# Patient Record
Sex: Male | Born: 1950 | Race: White | Hispanic: No | Marital: Single
Health system: Southern US, Community
[De-identification: ages and names within clinical notes are randomized; demographics above are authoritative.]

---

## 2004-01-21 ENCOUNTER — Emergency Department (HOSPITAL_COMMUNITY): Admission: EM | Admit: 2004-01-21 | Discharge: 2004-01-21 | Payer: Self-pay | Admitting: Emergency Medicine

## 2004-02-10 ENCOUNTER — Ambulatory Visit: Payer: Self-pay | Admitting: Internal Medicine

## 2004-02-12 ENCOUNTER — Ambulatory Visit (HOSPITAL_COMMUNITY): Admission: RE | Admit: 2004-02-12 | Discharge: 2004-02-12 | Payer: Self-pay | Admitting: Internal Medicine

## 2004-02-14 ENCOUNTER — Encounter: Admission: RE | Admit: 2004-02-14 | Discharge: 2004-02-14 | Payer: Self-pay | Admitting: Internal Medicine

## 2004-05-26 ENCOUNTER — Ambulatory Visit: Payer: Self-pay | Admitting: Internal Medicine

## 2004-10-03 ENCOUNTER — Emergency Department (HOSPITAL_COMMUNITY): Admission: EM | Admit: 2004-10-03 | Discharge: 2004-10-03 | Payer: Self-pay | Admitting: Emergency Medicine

## 2004-11-03 ENCOUNTER — Ambulatory Visit: Payer: Self-pay | Admitting: Hospitalist

## 2004-11-18 ENCOUNTER — Ambulatory Visit: Payer: Self-pay | Admitting: Internal Medicine

## 2004-12-15 ENCOUNTER — Ambulatory Visit: Payer: Self-pay | Admitting: Internal Medicine

## 2004-12-30 ENCOUNTER — Emergency Department (HOSPITAL_COMMUNITY): Admission: EM | Admit: 2004-12-30 | Discharge: 2004-12-30 | Payer: Self-pay | Admitting: Emergency Medicine

## 2005-02-05 ENCOUNTER — Ambulatory Visit: Payer: Self-pay | Admitting: Hospitalist

## 2005-03-23 ENCOUNTER — Ambulatory Visit: Payer: Self-pay | Admitting: Internal Medicine

## 2005-06-01 ENCOUNTER — Ambulatory Visit: Payer: Self-pay | Admitting: Internal Medicine

## 2005-07-20 ENCOUNTER — Ambulatory Visit: Payer: Self-pay | Admitting: Internal Medicine

## 2005-07-22 ENCOUNTER — Inpatient Hospital Stay (HOSPITAL_COMMUNITY): Admission: EM | Admit: 2005-07-22 | Discharge: 2005-07-23 | Payer: Self-pay | Admitting: Emergency Medicine

## 2005-07-22 ENCOUNTER — Ambulatory Visit: Payer: Self-pay | Admitting: Internal Medicine

## 2005-10-27 ENCOUNTER — Emergency Department (HOSPITAL_COMMUNITY): Admission: EM | Admit: 2005-10-27 | Discharge: 2005-10-27 | Payer: Self-pay | Admitting: Emergency Medicine

## 2005-11-01 ENCOUNTER — Ambulatory Visit: Payer: Self-pay | Admitting: Internal Medicine

## 2005-11-18 ENCOUNTER — Ambulatory Visit: Payer: Self-pay | Admitting: Internal Medicine

## 2006-02-07 DIAGNOSIS — M479 Spondylosis, unspecified: Secondary | ICD-10-CM | POA: Insufficient documentation

## 2006-02-07 DIAGNOSIS — M899 Disorder of bone, unspecified: Secondary | ICD-10-CM | POA: Insufficient documentation

## 2006-02-07 DIAGNOSIS — M949 Disorder of cartilage, unspecified: Secondary | ICD-10-CM

## 2006-02-07 DIAGNOSIS — E291 Testicular hypofunction: Secondary | ICD-10-CM

## 2006-02-07 DIAGNOSIS — I10 Essential (primary) hypertension: Secondary | ICD-10-CM | POA: Insufficient documentation

## 2006-02-07 DIAGNOSIS — F1020 Alcohol dependence, uncomplicated: Secondary | ICD-10-CM | POA: Insufficient documentation

## 2006-02-09 ENCOUNTER — Ambulatory Visit: Payer: Self-pay | Admitting: Internal Medicine

## 2006-02-09 ENCOUNTER — Encounter (INDEPENDENT_AMBULATORY_CARE_PROVIDER_SITE_OTHER): Payer: Self-pay | Admitting: Internal Medicine

## 2006-02-09 DIAGNOSIS — M545 Low back pain: Secondary | ICD-10-CM | POA: Insufficient documentation

## 2006-02-09 DIAGNOSIS — K219 Gastro-esophageal reflux disease without esophagitis: Secondary | ICD-10-CM | POA: Insufficient documentation

## 2006-02-09 DIAGNOSIS — G589 Mononeuropathy, unspecified: Secondary | ICD-10-CM | POA: Insufficient documentation

## 2006-02-09 LAB — CONVERTED CEMR LAB
Amphetamine Screen, Ur: NEGATIVE
BUN: 13 mg/dL (ref 6–23)
Barbiturate Quant, Ur: NEGATIVE
Benzodiazepines.: NEGATIVE
CO2: 29 meq/L (ref 19–32)
Calcium: 9.5 mg/dL (ref 8.4–10.5)
Chloride: 101 meq/L (ref 96–112)
Cocaine Metabolites: NEGATIVE
Creatinine, Ser: 1.23 mg/dL (ref 0.40–1.50)
Creatinine,U: 209.4 mg/dL
Glucose, Bld: 95 mg/dL (ref 70–99)
Marijuana Metabolite: NEGATIVE
Methadone: NEGATIVE
Opiates: POSITIVE — AB
PSA: 0.18 ng/mL (ref 0.10–4.00)
Phencyclidine (PCP): NEGATIVE
Potassium: 3.7 meq/L (ref 3.5–5.3)
Propoxyphene: NEGATIVE
Sodium: 143 meq/L (ref 135–145)

## 2006-02-21 ENCOUNTER — Ambulatory Visit: Payer: Self-pay | Admitting: Hospitalist

## 2006-02-22 ENCOUNTER — Telehealth (INDEPENDENT_AMBULATORY_CARE_PROVIDER_SITE_OTHER): Payer: Self-pay | Admitting: Internal Medicine

## 2006-02-22 DIAGNOSIS — R195 Other fecal abnormalities: Secondary | ICD-10-CM | POA: Insufficient documentation

## 2006-02-23 ENCOUNTER — Encounter (INDEPENDENT_AMBULATORY_CARE_PROVIDER_SITE_OTHER): Payer: Self-pay | Admitting: Internal Medicine

## 2006-02-23 ENCOUNTER — Ambulatory Visit: Payer: Self-pay | Admitting: Hospitalist

## 2006-02-23 LAB — CONVERTED CEMR LAB
BUN: 14 mg/dL (ref 6–23)
CO2: 26 meq/L (ref 19–32)
Calcium: 9.6 mg/dL (ref 8.4–10.5)
Chloride: 98 meq/L (ref 96–112)
Creatinine, Ser: 1.22 mg/dL (ref 0.40–1.50)
Glucose, Bld: 83 mg/dL (ref 70–99)
Potassium: 3.9 meq/L (ref 3.5–5.3)
Sodium: 136 meq/L (ref 135–145)

## 2006-03-07 ENCOUNTER — Telehealth (INDEPENDENT_AMBULATORY_CARE_PROVIDER_SITE_OTHER): Payer: Self-pay | Admitting: Hospitalist

## 2006-04-14 ENCOUNTER — Telehealth: Payer: Self-pay | Admitting: *Deleted

## 2006-06-09 ENCOUNTER — Telehealth (INDEPENDENT_AMBULATORY_CARE_PROVIDER_SITE_OTHER): Payer: Self-pay | Admitting: *Deleted

## 2006-07-15 ENCOUNTER — Telehealth: Payer: Self-pay | Admitting: *Deleted

## 2006-08-15 ENCOUNTER — Telehealth: Payer: Self-pay | Admitting: *Deleted

## 2006-08-15 IMAGING — CR DG LUMBAR SPINE COMPLETE 4+V
5 series · 5 of 5 positions shown · non-contrast
Comparison: none

HISTORY: Back pain

LUMBAR SPINE 4 VIEWS:
5 lumbar vertebra.
Disc space narrowing L5-S1 with endplate sclerosis, vacuum phenomenon, and
endplate spur formation.
No fracture, subluxation, or bone destruction.
No spondylolysis evident.
SI joints symmetric.

[view not recorded (1 of 5)]
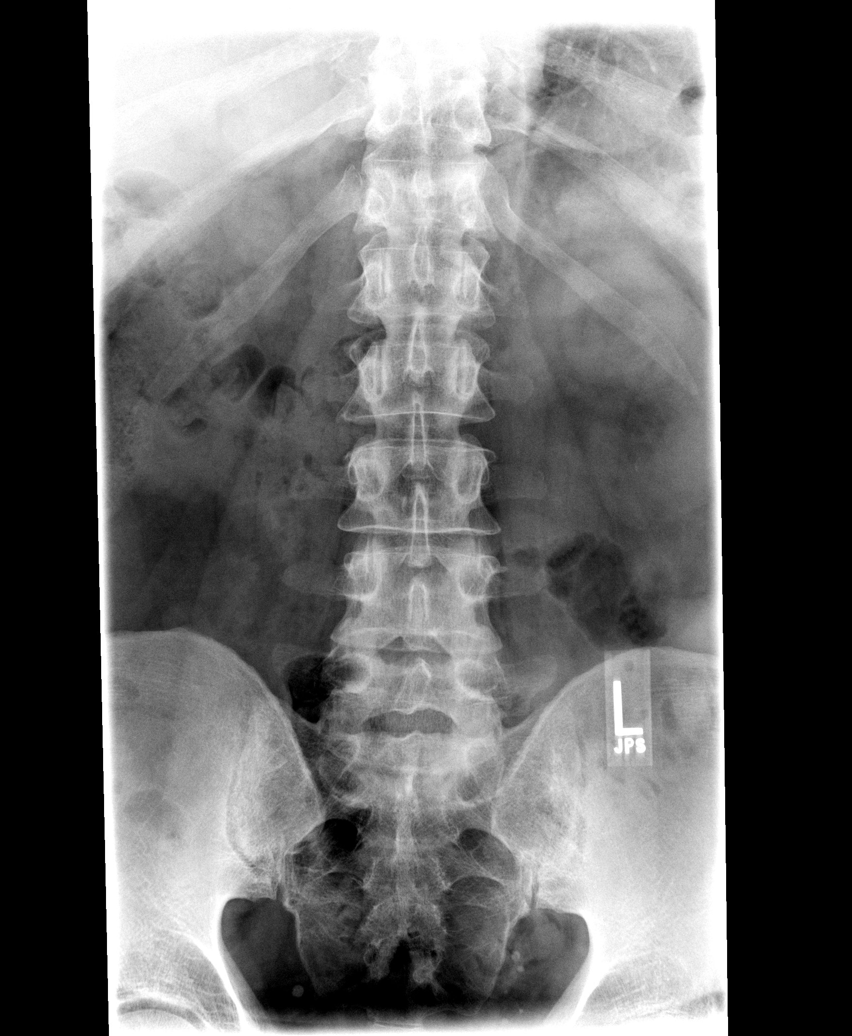

[view not recorded (2 of 5)]
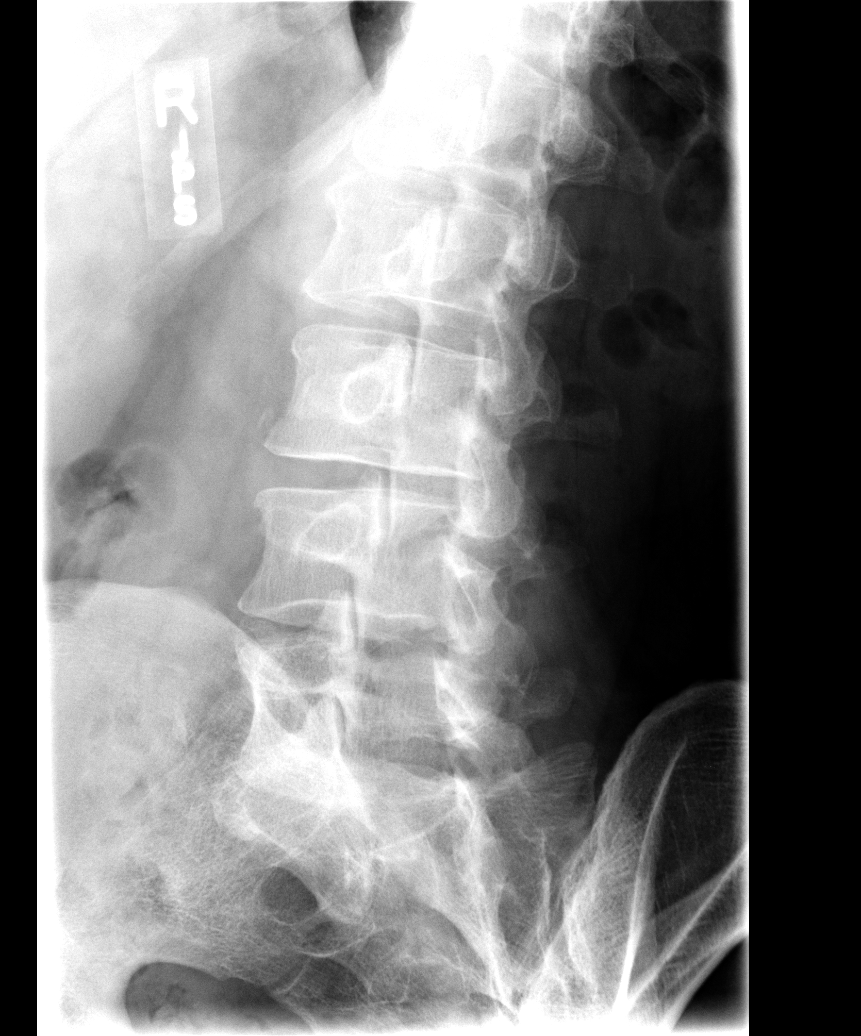

[view not recorded (3 of 5)]
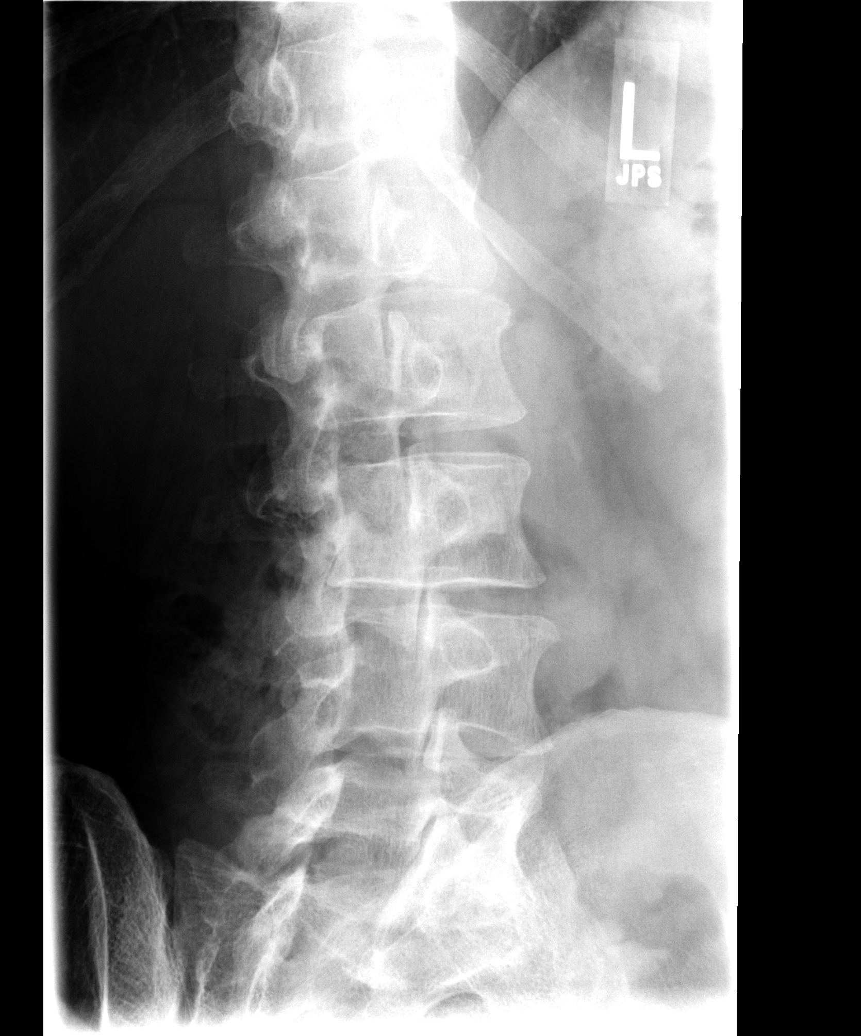

[view not recorded (4 of 5)]
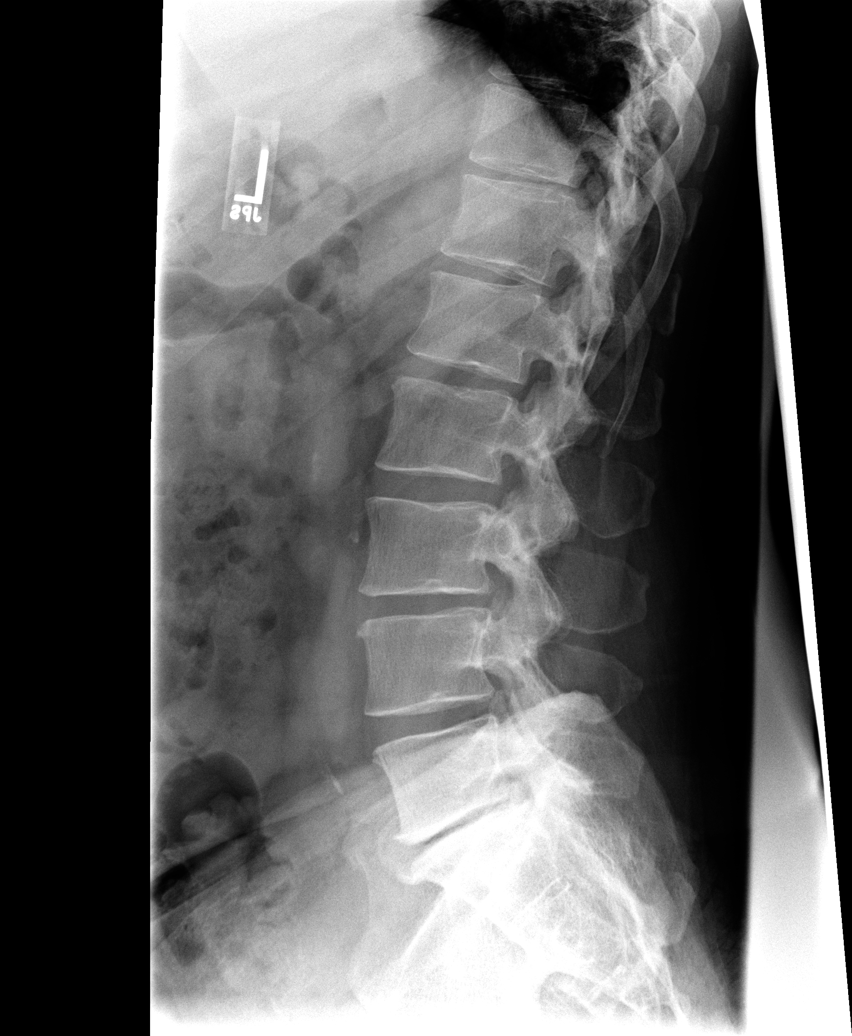

[view not recorded (5 of 5)]
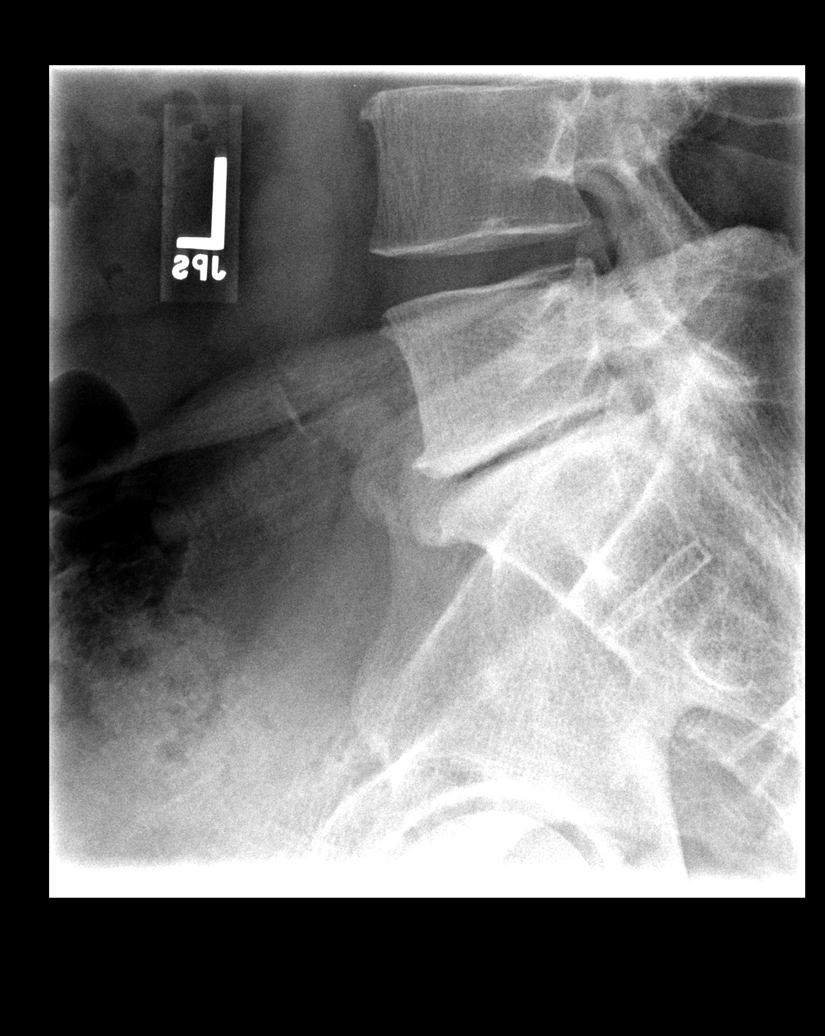

[5 of 5 positions shown; findings below may reference images not displayed]

IMPRESSION: Degenerative disc disease changes L5-S1.
No acute abnormalities.

## 2006-08-15 IMAGING — CR DG THORACIC SPINE 2V
2 series · 2 of 2 positions shown · non-contrast
Comparison: None.

CLINICAL DATA: Back pain.
 TWO-VIEW THORACIC SPINE, 01/21/04:

[view not recorded (1 of 2)]
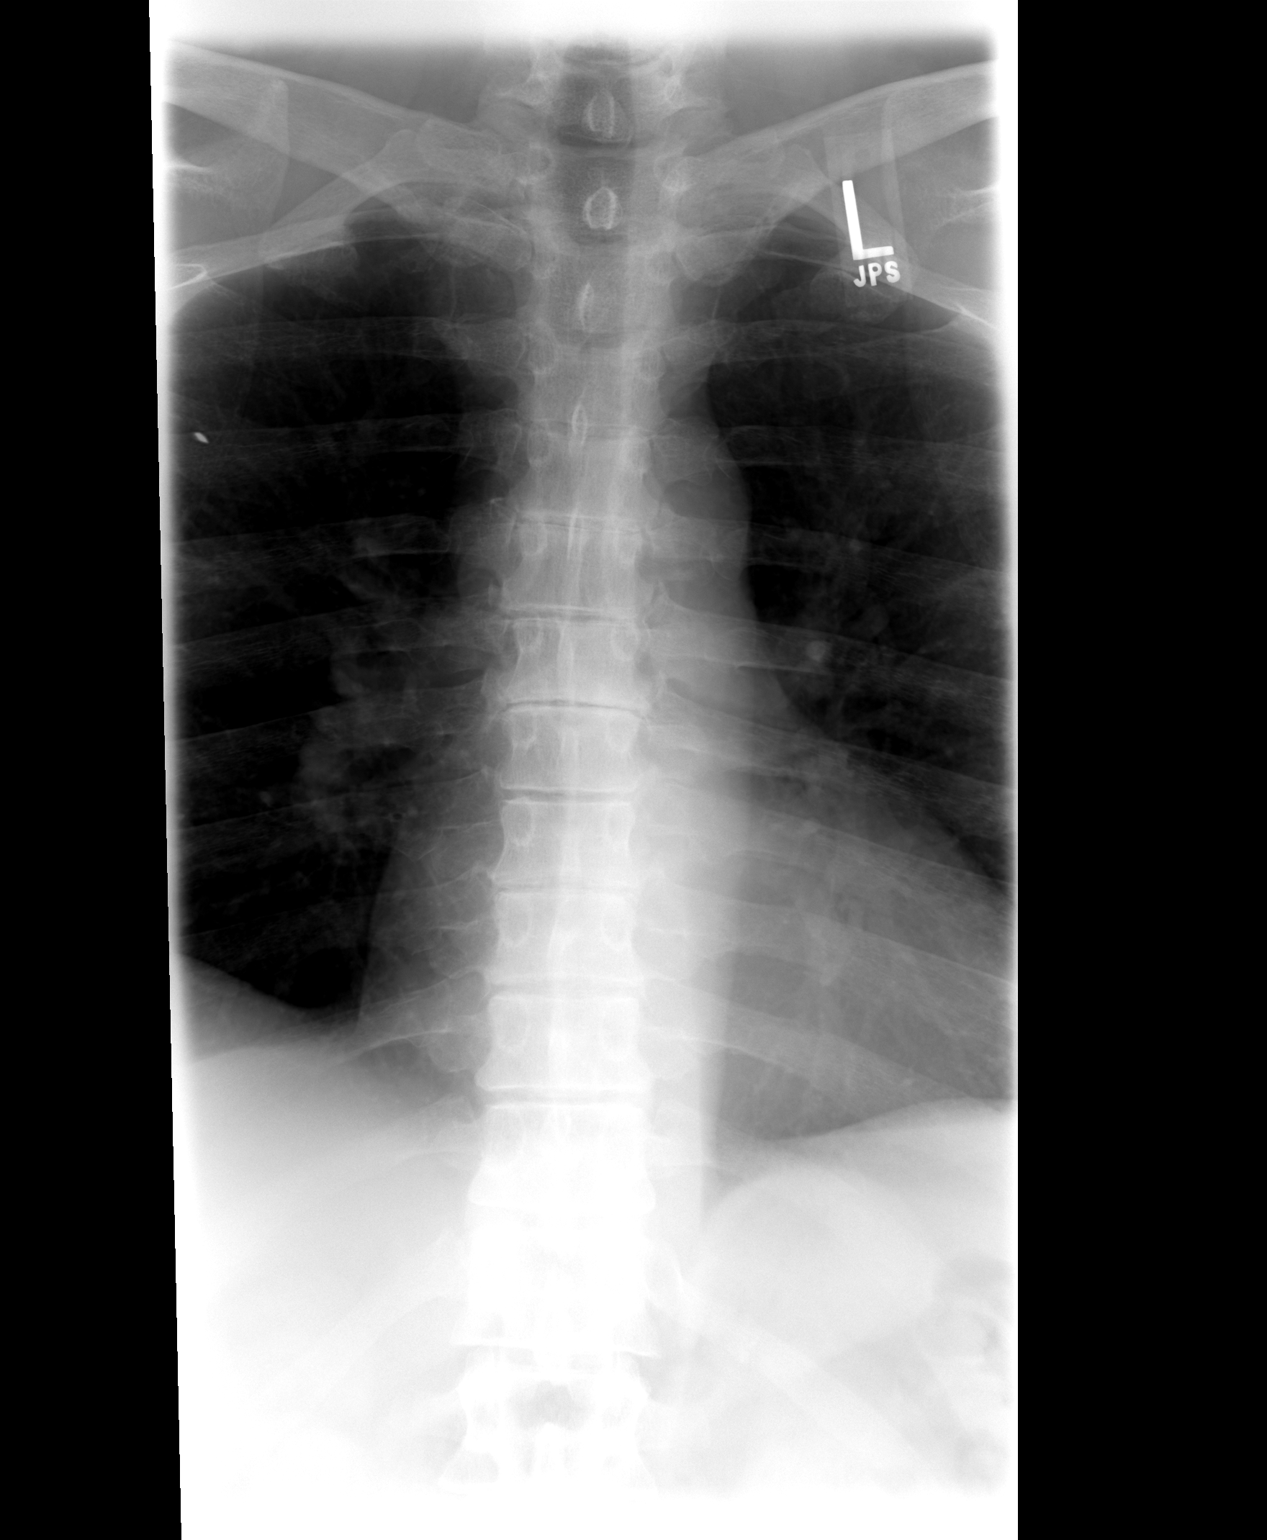

[view not recorded (2 of 2)]
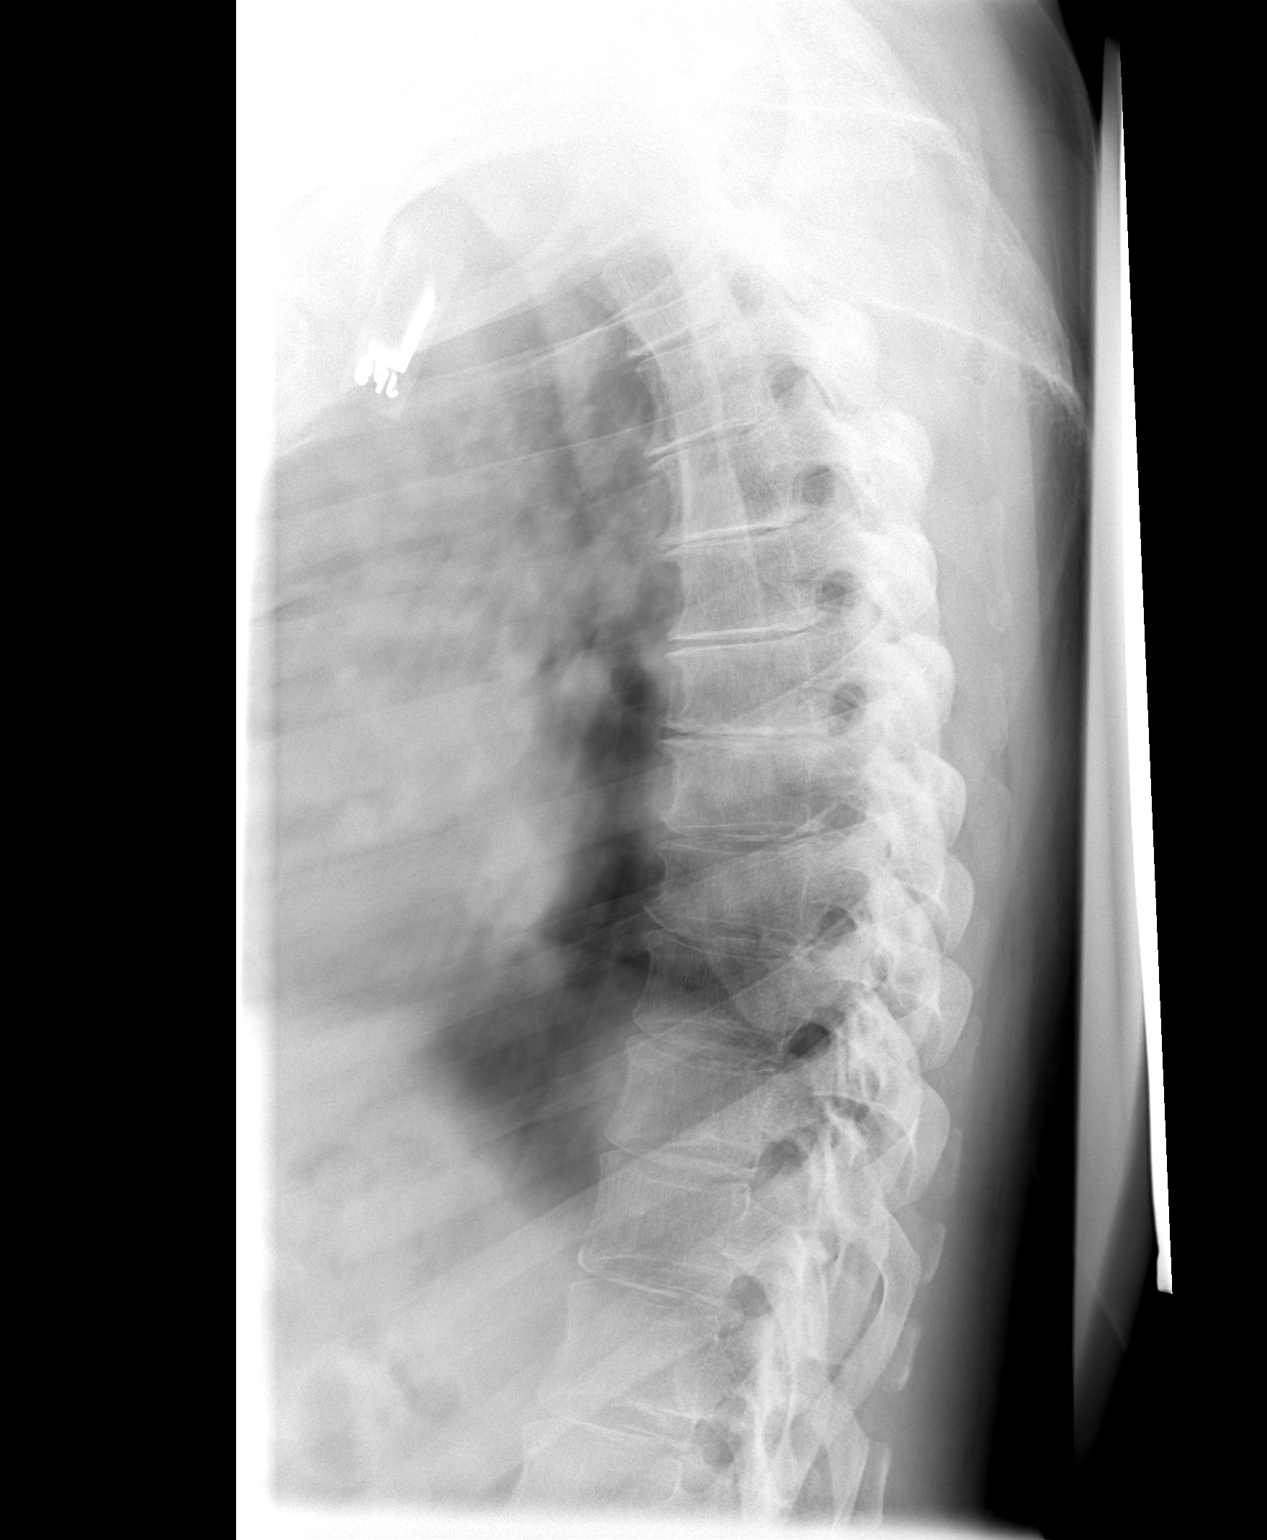

[2 of 2 positions shown; findings below may reference images not displayed]

FINDINGS: Two-view exam of the thoracic spine shows no acute fracture or subluxation.  Multilevel degenerative disc change is noted with end plate hypertrophy.  Bones are osteopenic.
IMPRESSION: No acute bony abnormality.

## 2006-09-15 ENCOUNTER — Telehealth: Payer: Self-pay | Admitting: *Deleted

## 2006-10-17 ENCOUNTER — Telehealth: Payer: Self-pay | Admitting: *Deleted

## 2006-11-02 ENCOUNTER — Encounter (INDEPENDENT_AMBULATORY_CARE_PROVIDER_SITE_OTHER): Payer: Self-pay | Admitting: Infectious Diseases

## 2006-11-02 ENCOUNTER — Ambulatory Visit: Payer: Self-pay | Admitting: Internal Medicine

## 2006-11-02 LAB — CONVERTED CEMR LAB
ALT: 22 units/L (ref 0–53)
AST: 21 units/L (ref 0–37)
Albumin: 3.9 g/dL (ref 3.5–5.2)
Alkaline Phosphatase: 84 units/L (ref 39–117)
BUN: 14 mg/dL (ref 6–23)
CO2: 24 meq/L (ref 19–32)
Calcium: 9.3 mg/dL (ref 8.4–10.5)
Chloride: 103 meq/L (ref 96–112)
Creatinine, Ser: 1.11 mg/dL (ref 0.40–1.50)
Glucose, Bld: 100 mg/dL — ABNORMAL HIGH (ref 70–99)
Potassium: 4 meq/L (ref 3.5–5.3)
Sodium: 136 meq/L (ref 135–145)
Total Bilirubin: 1 mg/dL (ref 0.3–1.2)
Total Protein: 6.8 g/dL (ref 6.0–8.3)

## 2006-11-14 ENCOUNTER — Telehealth (INDEPENDENT_AMBULATORY_CARE_PROVIDER_SITE_OTHER): Payer: Self-pay | Admitting: Infectious Diseases

## 2006-12-16 ENCOUNTER — Telehealth: Payer: Self-pay | Admitting: *Deleted

## 2007-04-17 ENCOUNTER — Telehealth (INDEPENDENT_AMBULATORY_CARE_PROVIDER_SITE_OTHER): Payer: Self-pay | Admitting: Infectious Diseases

## 2007-04-20 ENCOUNTER — Telehealth: Payer: Self-pay | Admitting: *Deleted

## 2007-07-17 ENCOUNTER — Telehealth (INDEPENDENT_AMBULATORY_CARE_PROVIDER_SITE_OTHER): Payer: Self-pay | Admitting: *Deleted

## 2007-08-02 ENCOUNTER — Ambulatory Visit: Payer: Self-pay | Admitting: Infectious Diseases

## 2007-08-02 ENCOUNTER — Encounter (INDEPENDENT_AMBULATORY_CARE_PROVIDER_SITE_OTHER): Payer: Self-pay | Admitting: Internal Medicine

## 2007-08-03 LAB — CONVERTED CEMR LAB
ALT: 25 units/L (ref 0–53)
AST: 23 units/L (ref 0–37)
Albumin: 4.6 g/dL (ref 3.5–5.2)
Alkaline Phosphatase: 93 units/L (ref 39–117)
BUN: 14 mg/dL (ref 6–23)
CO2: 24 meq/L (ref 19–32)
Calcium: 10 mg/dL (ref 8.4–10.5)
Chloride: 99 meq/L (ref 96–112)
Cholesterol: 267 mg/dL — ABNORMAL HIGH (ref 0–200)
Creatinine, Ser: 1.49 mg/dL (ref 0.40–1.50)
Glucose, Bld: 93 mg/dL (ref 70–99)
HDL: 62 mg/dL (ref >39)
LDL Cholesterol: 155 mg/dL — ABNORMAL HIGH (ref 0–99)
Potassium: 4.4 meq/L (ref 3.5–5.3)
Sodium: 137 meq/L (ref 135–145)
Total Bilirubin: 0.8 mg/dL (ref 0.3–1.2)
Total CHOL/HDL Ratio: 4.3
Total Protein: 7.9 g/dL (ref 6.0–8.3)
Triglycerides: 248 mg/dL — ABNORMAL HIGH (ref <150)
VLDL: 50 mg/dL — ABNORMAL HIGH (ref 0–40)

## 2007-09-18 ENCOUNTER — Telehealth (INDEPENDENT_AMBULATORY_CARE_PROVIDER_SITE_OTHER): Payer: Self-pay | Admitting: Internal Medicine

## 2007-10-19 ENCOUNTER — Encounter (INDEPENDENT_AMBULATORY_CARE_PROVIDER_SITE_OTHER): Payer: Self-pay | Admitting: Internal Medicine

## 2007-10-23 ENCOUNTER — Telehealth (INDEPENDENT_AMBULATORY_CARE_PROVIDER_SITE_OTHER): Payer: Self-pay | Admitting: *Deleted

## 2007-11-23 ENCOUNTER — Telehealth (INDEPENDENT_AMBULATORY_CARE_PROVIDER_SITE_OTHER): Payer: Self-pay | Admitting: Internal Medicine

## 2007-11-27 ENCOUNTER — Telehealth (INDEPENDENT_AMBULATORY_CARE_PROVIDER_SITE_OTHER): Payer: Self-pay | Admitting: Internal Medicine

## 2008-03-29 ENCOUNTER — Telehealth (INDEPENDENT_AMBULATORY_CARE_PROVIDER_SITE_OTHER): Payer: Self-pay | Admitting: *Deleted

## 2008-04-15 ENCOUNTER — Encounter (INDEPENDENT_AMBULATORY_CARE_PROVIDER_SITE_OTHER): Payer: Self-pay | Admitting: Internal Medicine

## 2008-04-22 ENCOUNTER — Ambulatory Visit: Payer: Self-pay | Admitting: Internal Medicine

## 2008-04-23 ENCOUNTER — Encounter (INDEPENDENT_AMBULATORY_CARE_PROVIDER_SITE_OTHER): Payer: Self-pay | Admitting: Internal Medicine

## 2008-05-15 ENCOUNTER — Telehealth (INDEPENDENT_AMBULATORY_CARE_PROVIDER_SITE_OTHER): Payer: Self-pay | Admitting: Internal Medicine

## 2008-05-23 ENCOUNTER — Encounter: Admission: RE | Admit: 2008-05-23 | Discharge: 2008-05-23 | Payer: Self-pay | Admitting: Anesthesiology

## 2008-06-05 ENCOUNTER — Telehealth (INDEPENDENT_AMBULATORY_CARE_PROVIDER_SITE_OTHER): Payer: Self-pay | Admitting: Internal Medicine

## 2008-09-11 ENCOUNTER — Telehealth (INDEPENDENT_AMBULATORY_CARE_PROVIDER_SITE_OTHER): Payer: Self-pay | Admitting: *Deleted

## 2010-06-12 NOTE — Discharge Summary (Signed)
NAME:  JAVAUGHN, OPDAHL NO.:  [id number]   MEDICAL RECORD NO.:  [id number]          PATIENT TYPE:  INP   LOCATION:  4707                         FACILITY:  [facility]   PHYSICIAN:  Hollace Hayward, M.D.   DATE OF BIRTH:  August 22, 1950   DATE OF ADMISSION:  07/21/2005  DATE OF DISCHARGE:  07/23/2005                                 DISCHARGE SUMMARY   Dictation ended at this point.      Hollace Hayward, M.D.     TE/MEDQ  D:  07/23/2005  T:  07/23/2005  Job:  [id number]
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
## 2020-11-11 ENCOUNTER — Telehealth: Payer: Self-pay

## 2020-11-11 NOTE — Telephone Encounter (Signed)
error 

## 2021-08-25 DIAGNOSIS — 419620001 Death: Secondary | SNOMED CT | POA: Diagnosis not present

## 2021-08-25 DEATH — deceased
